# Patient Record
Sex: Female | Born: 1980 | Race: White | Hispanic: No | Marital: Single | Smoking: Current every day smoker
Health system: Southern US, Community
[De-identification: ages and names within clinical notes are randomized; demographics above are authoritative.]

## PROBLEM LIST (undated history)

## (undated) DIAGNOSIS — J45909 Unspecified asthma, uncomplicated: Secondary | ICD-10-CM

---
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
## 2016-07-18 ENCOUNTER — Emergency Department (HOSPITAL_COMMUNITY): Payer: Self-pay

## 2016-07-18 ENCOUNTER — Emergency Department (HOSPITAL_COMMUNITY)
Admission: EM | Admit: 2016-07-18 | Discharge: 2016-07-18 | Disposition: A | Discharge status: Home / Self Care | Payer: Self-pay | Attending: Emergency Medicine | Admitting: Emergency Medicine

## 2016-07-18 ENCOUNTER — Encounter (HOSPITAL_COMMUNITY): Payer: Self-pay

## 2016-07-18 DIAGNOSIS — Y999 Unspecified external cause status: Secondary | ICD-10-CM | POA: Insufficient documentation

## 2016-07-18 DIAGNOSIS — Y939 Activity, unspecified: Secondary | ICD-10-CM | POA: Insufficient documentation

## 2016-07-18 DIAGNOSIS — S46912A Strain of unspecified muscle, fascia and tendon at shoulder and upper arm level, left arm, initial encounter: Secondary | ICD-10-CM | POA: Insufficient documentation

## 2016-07-18 DIAGNOSIS — Y92410 Unspecified street and highway as the place of occurrence of the external cause: Secondary | ICD-10-CM | POA: Insufficient documentation

## 2016-07-18 DIAGNOSIS — S46812A Strain of other muscles, fascia and tendons at shoulder and upper arm level, left arm, initial encounter: Secondary | ICD-10-CM

## 2016-07-18 DIAGNOSIS — J45909 Unspecified asthma, uncomplicated: Secondary | ICD-10-CM | POA: Insufficient documentation

## 2016-07-18 DIAGNOSIS — F17200 Nicotine dependence, unspecified, uncomplicated: Secondary | ICD-10-CM | POA: Insufficient documentation

## 2016-07-18 HISTORY — DX: Unspecified asthma, uncomplicated: J45.909

## 2016-07-18 MED ORDER — NAPROXEN 500 MG PO TABS: 500.0000 mg | ORAL_TABLET | Freq: Two times a day (BID) | ORAL | 0 refills | Status: AC | PRN

## 2016-07-18 MED ORDER — KETOROLAC TROMETHAMINE 60 MG/2ML IM SOLN
60.0000 mg | Freq: Once | INTRAMUSCULAR | Status: AC
  Administered 2016-07-18: 60 mg via INTRAMUSCULAR
  Filled 2016-07-18: qty 2

## 2016-07-18 MED ORDER — CYCLOBENZAPRINE HCL 10 MG PO TABS: 10.0000 mg | ORAL_TABLET | Freq: Three times a day (TID) | ORAL | 0 refills | Status: AC | PRN
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
